# Patient Record
Sex: Male | Born: 1995 | Race: Black or African American | Hispanic: No | Marital: Single | Smoking: Never smoker
Health system: Southern US, Community
[De-identification: ages and names within clinical notes are randomized; demographics above are authoritative.]

## PROBLEM LIST (undated history)

## (undated) DIAGNOSIS — F988 Other specified behavioral and emotional disorders with onset usually occurring in childhood and adolescence: Secondary | ICD-10-CM

---
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
## 2014-06-19 ENCOUNTER — Emergency Department (HOSPITAL_COMMUNITY): Payer: Medicaid Other

## 2014-06-19 ENCOUNTER — Observation Stay (HOSPITAL_COMMUNITY)
Admission: EM | Admit: 2014-06-19 | Discharge: 2014-06-20 | Disposition: A | Discharge status: Home / Self Care | Payer: Medicaid Other | Attending: General Surgery | Admitting: General Surgery

## 2014-06-19 ENCOUNTER — Encounter (HOSPITAL_COMMUNITY): Payer: Self-pay | Admitting: Emergency Medicine

## 2014-06-19 DIAGNOSIS — M542 Cervicalgia: Secondary | ICD-10-CM | POA: Insufficient documentation

## 2014-06-19 DIAGNOSIS — S00511A Abrasion of lip, initial encounter: Secondary | ICD-10-CM | POA: Insufficient documentation

## 2014-06-19 DIAGNOSIS — Y998 Other external cause status: Secondary | ICD-10-CM | POA: Insufficient documentation

## 2014-06-19 DIAGNOSIS — S62002A Unspecified fracture of navicular [scaphoid] bone of left wrist, initial encounter for closed fracture: Secondary | ICD-10-CM

## 2014-06-19 DIAGNOSIS — S301XXA Contusion of abdominal wall, initial encounter: Principal | ICD-10-CM | POA: Insufficient documentation

## 2014-06-19 DIAGNOSIS — F988 Other specified behavioral and emotional disorders with onset usually occurring in childhood and adolescence: Secondary | ICD-10-CM | POA: Insufficient documentation

## 2014-06-19 DIAGNOSIS — S00211A Abrasion of right eyelid and periocular area, initial encounter: Secondary | ICD-10-CM | POA: Insufficient documentation

## 2014-06-19 DIAGNOSIS — S51012A Laceration without foreign body of left elbow, initial encounter: Secondary | ICD-10-CM

## 2014-06-19 DIAGNOSIS — Y92413 State road as the place of occurrence of the external cause: Secondary | ICD-10-CM | POA: Insufficient documentation

## 2014-06-19 DIAGNOSIS — Y9389 Activity, other specified: Secondary | ICD-10-CM | POA: Insufficient documentation

## 2014-06-19 DIAGNOSIS — W458XXA Other foreign body or object entering through skin, initial encounter: Secondary | ICD-10-CM | POA: Insufficient documentation

## 2014-06-19 DIAGNOSIS — W25XXXA Contact with sharp glass, initial encounter: Secondary | ICD-10-CM | POA: Insufficient documentation

## 2014-06-19 DIAGNOSIS — S35513A Injury of unspecified iliac artery, initial encounter: Secondary | ICD-10-CM | POA: Insufficient documentation

## 2014-06-19 LAB — URINALYSIS, ROUTINE W REFLEX MICROSCOPIC
Bilirubin Urine: NEGATIVE
GLUCOSE, UA: NEGATIVE mg/dL
HGB URINE DIPSTICK: NEGATIVE
Ketones, ur: NEGATIVE mg/dL
Leukocytes, UA: NEGATIVE
Nitrite: NEGATIVE
PROTEIN: NEGATIVE mg/dL
Specific Gravity, Urine: 1.044 — ABNORMAL HIGH (ref 1.005–1.030)
Urobilinogen, UA: 1 mg/dL (ref 0.0–1.0)
pH: 7.5 (ref 5.0–8.0)

## 2014-06-19 LAB — I-STAT CHEM 8, ED
BUN: 12 mg/dL (ref 6–23)
CALCIUM ION: 1.25 mmol/L — AB (ref 1.12–1.23)
CREATININE: 1.1 mg/dL (ref 0.50–1.35)
Chloride: 102 mmol/L (ref 96–112)
GLUCOSE: 96 mg/dL (ref 70–99)
HEMATOCRIT: 51 % (ref 39.0–52.0)
HEMOGLOBIN: 17.3 g/dL — AB (ref 13.0–17.0)
Potassium: 3.5 mmol/L (ref 3.5–5.1)
SODIUM: 143 mmol/L (ref 135–145)
TCO2: 24 mmol/L (ref 0–100)

## 2014-06-19 MED ORDER — IOHEXOL 300 MG/ML  SOLN
100.0000 mL | Freq: Once | INTRAMUSCULAR | Status: AC | PRN
  Administered 2014-06-19: 100 mL via INTRAVENOUS

## 2014-06-19 MED ORDER — FENTANYL CITRATE 0.05 MG/ML IJ SOLN
25.0000 ug | Freq: Once | INTRAMUSCULAR | Status: AC
  Administered 2014-06-19: 25 ug via INTRAVENOUS
  Filled 2014-06-19: qty 2

## 2014-06-19 MED ORDER — LIDOCAINE-EPINEPHRINE (PF) 2 %-1:200000 IJ SOLN
20.0000 mL | Freq: Once | INTRAMUSCULAR | Status: AC
  Administered 2014-06-19: 20 mL
  Filled 2014-06-19: qty 20

## 2014-06-19 MED ORDER — TETANUS-DIPHTH-ACELL PERTUSSIS 5-2.5-18.5 LF-MCG/0.5 IM SUSP
0.5000 mL | Freq: Once | INTRAMUSCULAR | Status: AC
  Administered 2014-06-19: 0.5 mL via INTRAMUSCULAR
  Filled 2014-06-19: qty 0.5

## 2014-06-19 MED ORDER — SODIUM CHLORIDE 0.9 % IV BOLUS (SEPSIS)
1000.0000 mL | Freq: Once | INTRAVENOUS | Status: AC
  Administered 2014-06-19: 1000 mL via INTRAVENOUS
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
## 2014-06-19 NOTE — Progress Notes (Signed)
Orthopedic Tech Progress Note Patient Details:  Carl Romero 11/13/1995 [id number] Applied fiberglass thumb spica splint to LUE.  Pulses, sensation, motion intact before and after splinting.  Capillary refill less than 2 seconds before and after splinting. Ortho Devices Type of Ortho Device: Thumb spica splint Ortho Device/Splint Location: LUE Ortho Device/Splint Interventions: Application   Lesle ChrisGilliland, Hideko Esselman L 06/19/2014, 8:38 PM
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
## 2014-06-19 NOTE — ED Notes (Signed)
Patient unable to provide urine sample at this time

## 2014-06-19 NOTE — ED Notes (Signed)
Suture cart at bedside 

## 2014-06-20 LAB — COMPREHENSIVE METABOLIC PANEL WITH GFR
ALT: 17 U/L (ref 0–53)
AST: 21 U/L (ref 0–37)
Albumin: 3.7 g/dL (ref 3.5–5.2)
Alkaline Phosphatase: 51 U/L (ref 39–117)
Anion gap: 10 (ref 5–15)
BUN: 9 mg/dL (ref 6–23)
CO2: 24 mmol/L (ref 19–32)
Calcium: 9.1 mg/dL (ref 8.4–10.5)
Chloride: 102 mmol/L (ref 96–112)
Creatinine, Ser: 1 mg/dL (ref 0.50–1.35)
GFR calc Af Amer: >90 mL/min
GFR calc non Af Amer: >90 mL/min
Glucose, Bld: 107 mg/dL — ABNORMAL HIGH (ref 70–99)
Potassium: 3.8 mmol/L (ref 3.5–5.1)
Sodium: 136 mmol/L (ref 135–145)
Total Bilirubin: 1.5 mg/dL — ABNORMAL HIGH (ref 0.3–1.2)
Total Protein: 6.1 g/dL (ref 6.0–8.3)

## 2014-06-20 LAB — CBC
HCT: 41.8 % (ref 39.0–52.0)
Hemoglobin: 14.6 g/dL (ref 13.0–17.0)
MCH: 30 pg (ref 26.0–34.0)
MCHC: 34.9 g/dL (ref 30.0–36.0)
MCV: 85.8 fL (ref 78.0–100.0)
Platelets: 205 K/uL (ref 150–400)
RBC: 4.87 MIL/uL (ref 4.22–5.81)
RDW: 13.4 % (ref 11.5–15.5)
WBC: 14.7 K/uL — ABNORMAL HIGH (ref 4.0–10.5)

## 2014-06-20 MED ORDER — DOCUSATE SODIUM 100 MG PO CAPS
100.0000 mg | ORAL_CAPSULE | Freq: Two times a day (BID) | ORAL | Status: DC
  Administered 2014-06-20 (×2): 100 mg via ORAL
  Filled 2014-06-20 (×3): qty 1

## 2014-06-20 MED ORDER — MORPHINE SULFATE 2 MG/ML IJ SOLN
1.0000 mg | INTRAMUSCULAR | Status: DC | PRN
  Administered 2014-06-20: 2 mg via INTRAVENOUS
  Filled 2014-06-20: qty 1

## 2014-06-20 MED ORDER — ACETAMINOPHEN 325 MG PO TABS: 650.0000 mg | ORAL_TABLET | ORAL | Status: DC | PRN

## 2014-06-20 MED ORDER — ONDANSETRON HCL 4 MG/2ML IJ SOLN
4.0000 mg | Freq: Four times a day (QID) | INTRAMUSCULAR | Status: DC | PRN
  Administered 2014-06-20: 4 mg via INTRAVENOUS
  Filled 2014-06-20: qty 2

## 2014-06-20 MED ORDER — ACETAMINOPHEN 325 MG PO TABS: 650.0000 mg | ORAL_TABLET | ORAL | Status: AC | PRN

## 2014-06-20 MED ORDER — KCL IN DEXTROSE-NACL 20-5-0.45 MEQ/L-%-% IV SOLN
INTRAVENOUS | Status: DC
  Administered 2014-06-20: 100 mL/h via INTRAVENOUS
  Filled 2014-06-20 (×3): qty 1000

## 2014-06-20 MED ORDER — ONDANSETRON HCL 4 MG PO TABS: 4.0000 mg | ORAL_TABLET | Freq: Four times a day (QID) | ORAL | Status: DC | PRN

## 2014-06-20 MED ORDER — HYDROCODONE-ACETAMINOPHEN 5-325 MG PO TABS
1.0000 | ORAL_TABLET | ORAL | Status: DC | PRN
  Administered 2014-06-20: 1 via ORAL
  Filled 2014-06-20: qty 1

## 2014-06-20 MED ORDER — ENOXAPARIN SODIUM 40 MG/0.4ML ~~LOC~~ SOLN
40.0000 mg | Freq: Every day | SUBCUTANEOUS | Status: DC
  Filled 2014-06-20: qty 0.4

## 2014-06-20 MED ORDER — HYDROCODONE-ACETAMINOPHEN 5-325 MG PO TABS: 1.0000 | ORAL_TABLET | Freq: Four times a day (QID) | ORAL | Status: DC | PRN
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
## 2014-06-20 NOTE — Progress Notes (Signed)
UR completed 
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
## 2014-06-20 NOTE — Discharge Instructions (Signed)
Blunt Abdominal Trauma °A blunt injury to the abdomen can cause pain. The pain is most likely from bruising and stretching of your muscles. This pain is often made worse with movement. Most often these injuries are not serious and get better within 1 week with rest and mild pain medicine. However, internal organs (liver, spleen, kidneys) can be injured with blunt trauma. If you do not get better or if you get worse, further examination may be needed. °Continue with your regular daily activities, but avoid any strenuous activities until your pain is improved. If your stomach is upset, stick to a clear liquid diet and slowly advance to solid food.  °SEEK IMMEDIATE MEDICAL CARE IF:  °· You develop increasing pain, nausea, or repeated vomiting. °· You develop chest pain or breathing difficulty. °· You develop blood in the urine, vomit, or stool. °· You develop weakness, fainting, fever, or other serious complaints. °Document Released: 04/09/2004 Document Revised: 05/25/2011 Document Reviewed: 07/26/2008 °ExitCare® Patient Information ©2015 [employer]. This information is not intended to replace advice given to you by your health care provider. Make sure you discuss any questions you have with your health care provider. ° °

## 2014-06-20 NOTE — Progress Notes (Signed)
Carl Romero discharged Home with family per MD order.  Discharge instructions reviewed and discussed with the patient, all questions and concerns answered. Copy of instructions, care notes for new medication & diagnosis and scripts given to patient.    Medication List    STOP taking these medications        neomycin-polymyxin-pramoxine 1 % cream  Commonly known as:  NEOSPORIN PLUS      TAKE these medications        acetaminophen 325 MG tablet  Commonly known as:  TYLENOL  Take 2 tablets (650 mg total) by mouth every 4 (four) hours as needed for mild pain.     HYDROcodone-acetaminophen 5-325 MG per tablet  Commonly known as:  NORCO/VICODIN  Take 1 tablet by mouth every 6 (six) hours as needed for moderate pain.        Patients skin is clean, dry, with abrasions to left elbow & lower lip, bruises to chest/abd. & splint to left arm . IV site discontinued and catheter remains intact. Site without signs and symptoms of complications. Dressing and pressure applied.  Patient escorted to car by this RN in a wheelchair,  no distress noted upon discharge.  Laural BenesJohnson, [name] 06/20/2014 4:18 PM

## 2014-06-20 NOTE — ED Notes (Signed)
Left hand/arm in splint.  Fingers warm to touch, +CMS,  Moves fingers without difficulty.
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
## 2014-06-22 ENCOUNTER — Other Ambulatory Visit: Payer: Self-pay | Admitting: Orthopedic Surgery

## 2014-06-25 MED ORDER — CEFAZOLIN SODIUM-DEXTROSE 2-3 GM-% IV SOLR
2.0000 g | INTRAVENOUS | Status: AC
  Administered 2014-06-26: 2 g via INTRAVENOUS
  Filled 2014-06-25: qty 50

## 2014-06-25 MED ORDER — SODIUM CHLORIDE 0.45 % IV SOLN: INTRAVENOUS | Status: DC

## 2014-06-25 MED ORDER — CHLORHEXIDINE GLUCONATE 4 % EX LIQD
60.0000 mL | Freq: Once | CUTANEOUS | Status: DC
  Filled 2014-06-25: qty 60

## 2014-06-25 NOTE — Progress Notes (Signed)
I was unable to reach patient by phone.  I left  A message on voice mail.  I instructed the patient to [facility] at 1200  , nothing to eat or drink after midnight.   I instructed the patient to take the following medications in the am with just enough water to get them down:HYDROcodone-acetaminophen.  I asked patient to not wear any lotions, powders, cologne, jewelry, piercing, make-up or nail polish.  I asked the patient to call [phone], in the am if there were any questions or problems.

## 2014-06-26 ENCOUNTER — Ambulatory Visit (HOSPITAL_COMMUNITY): Payer: No Typology Code available for payment source | Admitting: Certified Registered Nurse Anesthetist

## 2014-06-26 ENCOUNTER — Ambulatory Visit (HOSPITAL_COMMUNITY): Payer: Self-pay | Admitting: Certified Registered Nurse Anesthetist

## 2014-06-26 ENCOUNTER — Encounter (HOSPITAL_COMMUNITY): Payer: Self-pay | Admitting: *Deleted

## 2014-06-26 ENCOUNTER — Ambulatory Visit (HOSPITAL_COMMUNITY)
Admission: RE | Admit: 2014-06-26 | Discharge: 2014-06-26 | Disposition: A | Discharge status: Home / Self Care | Payer: Self-pay | Source: Ambulatory Visit | Attending: Orthopedic Surgery | Admitting: Orthopedic Surgery

## 2014-06-26 ENCOUNTER — Encounter (HOSPITAL_COMMUNITY)
Admission: RE | Disposition: A | Discharge status: Home / Self Care | Payer: Self-pay | Source: Ambulatory Visit | Attending: Orthopedic Surgery

## 2014-06-26 DIAGNOSIS — S62032A Displaced fracture of proximal third of navicular [scaphoid] bone of left wrist, initial encounter for closed fracture: Secondary | ICD-10-CM | POA: Insufficient documentation

## 2014-06-26 DIAGNOSIS — Y999 Unspecified external cause status: Secondary | ICD-10-CM | POA: Insufficient documentation

## 2014-06-26 DIAGNOSIS — Y929 Unspecified place or not applicable: Secondary | ICD-10-CM | POA: Insufficient documentation

## 2014-06-26 DIAGNOSIS — Y939 Activity, unspecified: Secondary | ICD-10-CM | POA: Insufficient documentation

## 2014-06-26 HISTORY — PX: ORIF SCAPHOID FRACTURE: SHX2130

## 2014-06-26 SURGERY — OPEN REDUCTION INTERNAL FIXATION (ORIF) SCAPHOID FRACTURE
Anesthesia: Monitor Anesthesia Care | Site: Wrist | Laterality: Left

## 2014-06-26 MED ORDER — OXYCODONE HCL 5 MG/5ML PO SOLN: 5.0000 mg | Freq: Once | ORAL | Status: DC | PRN

## 2014-06-26 MED ORDER — ACETAMINOPHEN 325 MG PO TABS: 325.0000 mg | ORAL_TABLET | ORAL | Status: DC | PRN

## 2014-06-26 MED ORDER — ROPIVACAINE HCL 5 MG/ML IJ SOLN
INTRAMUSCULAR | Status: DC | PRN
  Administered 2014-06-26: 25 mL via PERINEURAL

## 2014-06-26 MED ORDER — PROPOFOL 10 MG/ML IV BOLUS
INTRAVENOUS | Status: AC
  Filled 2014-06-26: qty 20

## 2014-06-26 MED ORDER — FENTANYL CITRATE 0.05 MG/ML IJ SOLN
INTRAMUSCULAR | Status: AC
  Administered 2014-06-26: 50 ug
  Filled 2014-06-26: qty 2

## 2014-06-26 MED ORDER — FENTANYL CITRATE 0.05 MG/ML IJ SOLN: 25.0000 ug | INTRAMUSCULAR | Status: DC | PRN

## 2014-06-26 MED ORDER — PROPOFOL INFUSION 10 MG/ML OPTIME
INTRAVENOUS | Status: DC | PRN
  Administered 2014-06-26: 50 ug/kg/min via INTRAVENOUS

## 2014-06-26 MED ORDER — OXYCODONE HCL 5 MG PO TABS
5.0000 mg | ORAL_TABLET | Freq: Once | ORAL | Status: DC | PRN
Start: 2014-06-26 — End: 2014-06-26

## 2014-06-26 MED ORDER — MIDAZOLAM HCL 5 MG/5ML IJ SOLN
INTRAMUSCULAR | Status: DC | PRN
  Administered 2014-06-26 (×2): 1 mg via INTRAVENOUS

## 2014-06-26 MED ORDER — LIDOCAINE HCL (CARDIAC) 20 MG/ML IV SOLN
INTRAVENOUS | Status: DC | PRN
  Administered 2014-06-26: 40 mg via INTRAVENOUS

## 2014-06-26 MED ORDER — MIDAZOLAM HCL 2 MG/2ML IJ SOLN
INTRAMUSCULAR | Status: AC
Start: 2014-06-26 — End: 2014-06-26
  Administered 2014-06-26: 2 mg
  Filled 2014-06-26: qty 2

## 2014-06-26 MED ORDER — PROPOFOL 10 MG/ML IV BOLUS
INTRAVENOUS | Status: DC | PRN
  Administered 2014-06-26: 30 mg via INTRAVENOUS

## 2014-06-26 MED ORDER — MIDAZOLAM HCL 2 MG/2ML IJ SOLN
INTRAMUSCULAR | Status: AC
  Filled 2014-06-26: qty 2

## 2014-06-26 MED ORDER — ACETAMINOPHEN 160 MG/5ML PO SOLN
325.0000 mg | ORAL | Status: DC | PRN
  Filled 2014-06-26: qty 25

## 2014-06-26 MED ORDER — LACTATED RINGERS IV SOLN
INTRAVENOUS | Status: DC | PRN
  Administered 2014-06-26: 14:00:00 via INTRAVENOUS

## 2014-06-26 MED ORDER — FENTANYL CITRATE 0.05 MG/ML IJ SOLN
INTRAMUSCULAR | Status: AC
  Filled 2014-06-26: qty 5

## 2014-06-26 MED ORDER — FENTANYL CITRATE 0.05 MG/ML IJ SOLN
INTRAMUSCULAR | Status: DC | PRN
  Administered 2014-06-26: 50 ug via INTRAVENOUS

## 2014-06-26 MED ORDER — CEPHALEXIN 500 MG PO CAPS: 500.0000 mg | ORAL_CAPSULE | Freq: Four times a day (QID) | ORAL | Status: DC

## 2014-06-26 MED ORDER — DEXAMETHASONE SODIUM PHOSPHATE 4 MG/ML IJ SOLN
INTRAMUSCULAR | Status: AC
  Administered 2014-06-26: 4 mg via PERINEURAL
  Filled 2014-06-26: qty 1

## 2014-06-26 MED ORDER — HYDROCODONE-ACETAMINOPHEN 5-325 MG PO TABS: 1.0000 | ORAL_TABLET | Freq: Four times a day (QID) | ORAL | Status: DC | PRN

## 2014-06-26 MED ORDER — BUPIVACAINE HCL (PF) 0.25 % IJ SOLN
INTRAMUSCULAR | Status: AC
  Filled 2014-06-26: qty 30

## 2014-06-26 MED ORDER — LACTATED RINGERS IV SOLN
Freq: Once | INTRAVENOUS | Status: AC
Start: 2014-06-26 — End: 2014-06-26
  Administered 2014-06-26: 13:00:00 via INTRAVENOUS

## 2014-06-26 MED ORDER — 0.9 % SODIUM CHLORIDE (POUR BTL) OPTIME
TOPICAL | Status: DC | PRN
  Administered 2014-06-26: 1000 mL

## 2014-06-26 SURGICAL SUPPLY — 48 items
BANDAGE ELASTIC 3 VELCRO ST LF (GAUZE/BANDAGES/DRESSINGS) ×3 IMPLANT
BIT DRILL MINI LNG ACUTRAK 2 (BIT) IMPLANT
BNDG CMPR 9X4 STRL LF SNTH (GAUZE/BANDAGES/DRESSINGS) ×1
BNDG CONFORM 3 STRL LF (GAUZE/BANDAGES/DRESSINGS) ×2 IMPLANT
BNDG ESMARK 4X9 LF (GAUZE/BANDAGES/DRESSINGS) ×3 IMPLANT
BNDG GAUZE ELAST 4 BULKY (GAUZE/BANDAGES/DRESSINGS) ×5 IMPLANT
CAP PIN ORTHO PINK (CAP) IMPLANT
CORDS BIPOLAR (ELECTRODE) ×3 IMPLANT
COVER SURGICAL LIGHT HANDLE (MISCELLANEOUS) ×3 IMPLANT
CUFF TOURNIQUET SINGLE 18IN (TOURNIQUET CUFF) ×3 IMPLANT
CUFF TOURNIQUET SINGLE 24IN (TOURNIQUET CUFF) IMPLANT
DRAPE OEC MINIVIEW 54X84 (DRAPES) ×2 IMPLANT
DRESSING ADAPTIC 1/2  N-ADH (PACKING) ×2 IMPLANT
DRILL MINI LNG ACUTRAK 2 (BIT) ×3
DRSG EMULSION OIL 3X3 NADH (GAUZE/BANDAGES/DRESSINGS) ×3 IMPLANT
GAUZE SPONGE 4X4 12PLY STRL (GAUZE/BANDAGES/DRESSINGS) ×3 IMPLANT
GAUZE XEROFORM 1X8 LF (GAUZE/BANDAGES/DRESSINGS) ×2 IMPLANT
GLOVE BIOGEL M STRL SZ7.5 (GLOVE) ×3 IMPLANT
GLOVE SS BIOGEL STRL SZ 8 (GLOVE) ×2 IMPLANT
GLOVE SUPERSENSE BIOGEL SZ 8 (GLOVE) ×4
GOWN STRL REUS W/ TWL LRG LVL3 (GOWN DISPOSABLE) ×3 IMPLANT
GOWN STRL REUS W/ TWL XL LVL3 (GOWN DISPOSABLE) ×2 IMPLANT
GOWN STRL REUS W/TWL LRG LVL3 (GOWN DISPOSABLE) ×9
GOWN STRL REUS W/TWL XL LVL3 (GOWN DISPOSABLE) ×6
GUIDEWIRE ORTHO MINI ACTK .045 (WIRE) ×4 IMPLANT
KIT BASIN OR (CUSTOM PROCEDURE TRAY) ×3 IMPLANT
KIT ROOM TURNOVER OR (KITS) ×3 IMPLANT
NDL HYPO 25GX1X1/2 BEV (NEEDLE) ×1 IMPLANT
NEEDLE HYPO 25GX1X1/2 BEV (NEEDLE) ×3 IMPLANT
NS IRRIG 1000ML POUR BTL (IV SOLUTION) ×3 IMPLANT
PACK ORTHO EXTREMITY (CUSTOM PROCEDURE TRAY) ×3 IMPLANT
PAD ARMBOARD 7.5X6 YLW CONV (MISCELLANEOUS) ×6 IMPLANT
PAD CAST 3X4 CTTN HI CHSV (CAST SUPPLIES) ×1 IMPLANT
PADDING CAST COTTON 3X4 STRL (CAST SUPPLIES) ×3
SCREW MINI ACTRK 22MM (Screw) ×2 IMPLANT
SPLINT PLASTER CAST XFAST 5X30 (CAST SUPPLIES) IMPLANT
SPLINT PLASTER XFAST SET 5X30 (CAST SUPPLIES) ×2
SPONGE GAUZE 4X4 12PLY STER LF (GAUZE/BANDAGES/DRESSINGS) ×2 IMPLANT
SUCTION FRAZIER TIP 10 FR DISP (SUCTIONS) IMPLANT
SUT PROLENE 4 0 PS 2 18 (SUTURE) ×3 IMPLANT
SUT VIC AB 3-0 FS2 27 (SUTURE) IMPLANT
SYR CONTROL 10ML LL (SYRINGE) ×3 IMPLANT
TOWEL OR 17X24 6PK STRL BLUE (TOWEL DISPOSABLE) ×3 IMPLANT
TOWEL OR 17X26 10 PK STRL BLUE (TOWEL DISPOSABLE) ×3 IMPLANT
TUBE CONNECTING 12'X1/4 (SUCTIONS)
TUBE CONNECTING 12X1/4 (SUCTIONS) IMPLANT
UNDERPAD 30X30 INCONTINENT (UNDERPADS AND DIAPERS) ×3 IMPLANT
WATER STERILE IRR 1000ML POUR (IV SOLUTION) ×3 IMPLANT

## 2014-06-26 NOTE — Anesthesia Postprocedure Evaluation (Signed)
  Anesthesia Post-op Note  Patient: Carl Romero  Procedure(s) Performed: Procedure(s): OPEN REDUCTION INTERNAL FIXATION (ORIF) LEFT SCAPHOID FRACTURE (Left)  Patient Location: PACU  Anesthesia Type:Regional  Level of Consciousness: awake  Airway and Oxygen Therapy: Patient Spontanous Breathing  Post-op Pain: mild  Post-op Assessment: Post-op Vital signs reviewed  Post-op Vital Signs: Reviewed  Last Vitals:  Filed Vitals:   06/26/14 1645  BP:   Pulse: 63  Temp:   Resp: 13    Complications: No apparent anesthesia complications

## 2014-06-26 NOTE — Progress Notes (Signed)
Orthopedic Tech Progress Note Patient Details:  Carl Romero 16-Jul-1995 [id number]  Ortho Devices Type of Ortho Device: Arm sling Ortho Device/Splint Location: lue Ortho Device/Splint Interventions: Application Viewed order from doctor's order list  Nikki DomCrawford, Juanette Urizar 06/26/2014, 4:39 PM

## 2014-06-26 NOTE — Anesthesia Preprocedure Evaluation (Addendum)
Anesthesia Evaluation  Patient identified by MRN, date of birth, ID band Patient awake    Reviewed: Allergy & Precautions, NPO status , Patient's Chart, lab work & pertinent test results  History of Anesthesia Complications Negative for: history of anesthetic complications  Airway Mallampati: I  TM Distance: >3 FB Neck ROM: Full    Dental  (+) Teeth Intact, Dental Advisory Given   Pulmonary neg pulmonary ROS,  breath sounds clear to auscultation        Cardiovascular negative cardio ROS  Rhythm:Regular Rate:Normal     Neuro/Psych PSYCHIATRIC DISORDERS negative neurological ROS     GI/Hepatic negative GI ROS, Neg liver ROS,   Endo/Other  negative endocrine ROS  Renal/GU negative Renal ROS     Musculoskeletal   Abdominal   Peds  Hematology negative hematology ROS (+)   Anesthesia Other Findings   Reproductive/Obstetrics                            Anesthesia Physical Anesthesia Plan  ASA: I  Anesthesia Plan: Regional and MAC   Post-op Pain Management:    Induction: Intravenous  Airway Management Planned: Nasal Cannula  Additional Equipment: None  Intra-op Plan:   Post-operative Plan: Extubation in OR  Informed Consent: I have reviewed the patients History and Physical, chart, labs and discussed the procedure including the risks, benefits and alternatives for the proposed anesthesia with the patient or authorized representative who has indicated his/her understanding and acceptance.   Dental advisory given  Plan Discussed with: Surgeon and CRNA  Anesthesia Plan Comments:        Anesthesia Quick Evaluation

## 2014-06-26 NOTE — Transfer of Care (Signed)
Immediate Anesthesia Transfer of Care Note  Patient: Carl Romero  Procedure(s) Performed: Procedure(s): OPEN REDUCTION INTERNAL FIXATION (ORIF) LEFT SCAPHOID FRACTURE (Left)  Patient Location: PACU  Anesthesia Type:MAC combined with regional for post-op pain  Level of Consciousness: awake, alert  and oriented  Airway & Oxygen Therapy: Patient Spontanous Breathing and Patient connected to nasal cannula oxygen  Post-op Assessment: Report given to RN and Post -op Vital signs reviewed and stable  Post vital signs: Reviewed and stable  Last Vitals:  Filed Vitals:   06/26/14 1200  BP: 149/87  Pulse: 72  Temp: 37.1 C  Resp: 18    Complications: No apparent anesthesia complications

## 2014-06-26 NOTE — Discharge Instructions (Signed)
.  We recommend that you to take vitamin C 1000 mg a day to promote healing. °We also recommend that if you require  pain medicine that you take a stool softener to prevent constipation as most pain medicines will have constipation side effects. We recommend either Peri-Colace or Senokot and recommend that you also consider adding MiraLAX to prevent the constipation affects from pain medicine if you are required to use them. These medicines are over the counter and maybe purchased at a local pharmacy. A cup of yogurt and a probiotic can also be helpful during the recovery process as the medicines can disrupt your intestinal environment. °.Keep bandage clean and dry.  Call for any problems.  No smoking.  Criteria for driving a car: you should be off your pain medicine for 7-8 hours, able to drive one handed(confident), thinking clearly and feeling able in your judgement to drive. °Continue elevation as it will decrease swelling.  If instructed by MD move your fingers within the confines of the bandage/splint.  Use ice if instructed by your MD. Call immediately for any sudden loss of feeling in your hand/arm or change in functional abilities of the extremity. °

## 2014-06-26 NOTE — Op Note (Signed)
See dictation# [id number] Brendan Gruwell Md
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
## 2014-06-26 NOTE — Anesthesia Procedure Notes (Signed)
Anesthesia Regional Block:  Supraclavicular block  Pre-Anesthetic Checklist: ,, timeout performed, Correct Patient, Correct Site, Correct Laterality, Correct Procedure, Correct Position, site marked, Risks and benefits discussed,  Surgical consent,  Pre-op evaluation,  At surgeon's request and post-op pain management  Laterality: Upper and Left  Prep: chloraprep       Needles:  Injection technique: Single-shot  Needle Type: Echogenic Needle          Additional Needles:  Procedures: ultrasound guided (picture in chart) Supraclavicular block Narrative:  Injection made incrementally with aspirations every 5 mL.  Performed by: Personally   Additional Notes: H+P and labs reviewed, risks and benefits discussed with patient, procedure tolerated well without complications      
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
## 2014-06-28 ENCOUNTER — Encounter (HOSPITAL_COMMUNITY): Payer: Self-pay | Admitting: Orthopedic Surgery
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
## 2016-04-06 ENCOUNTER — Ambulatory Visit (INDEPENDENT_AMBULATORY_CARE_PROVIDER_SITE_OTHER): Payer: Self-pay | Admitting: Physician Assistant

## 2016-04-06 ENCOUNTER — Encounter: Payer: Self-pay | Admitting: Physician Assistant

## 2016-04-06 VITALS — BP 122/76 | HR 81 | Temp 98.8°F | Ht 72.0 in | Wt 184.8 lb

## 2016-04-06 DIAGNOSIS — J029 Acute pharyngitis, unspecified: Secondary | ICD-10-CM

## 2016-04-06 LAB — POCT RAPID STREP A (OFFICE): RAPID STREP A SCREEN: NEGATIVE

## 2016-04-06 MED ORDER — NAPROXEN 500 MG PO TABS: 500.0000 mg | ORAL_TABLET | Freq: Two times a day (BID) | ORAL | 0 refills | Status: DC

## 2016-04-06 NOTE — Patient Instructions (Signed)
     IF you received an x-ray today, you will receive an invoice from [facility]. Please contact [facility] at [phone] with questions or concerns regarding your invoice.   IF you received labwork today, you will receive an invoice from [facility]. Please contact [facility] at [phone] with questions or concerns regarding your invoice.   Our billing staff will not be able to assist you with questions regarding bills from these companies.  You will be contacted with the lab results as soon as they are available. The fastest way to get your results is to activate your My Chart account. Instructions are located on the last page of this paperwork. If you have not heard from us regarding the results in 2 weeks, please contact this office.     

## 2016-04-06 NOTE — Progress Notes (Signed)
  04/06/2016 3:18 PM   DOB: 01-11-1996 / MRN: [id number]  SUBJECTIVE:  Carl Romero is a 21 y.o. male presenting for sore throat, cough and nasal congestion that started 8 days ago.  The nasal congestion is mostly resolved and the cough has resolved.  The sore throat has improved and now feels mostly scratchy. Denies fever, chills, nausea.  Is tolerating PO.  No dizziness or diaphoresis.    Review of Systems  Constitutional: Negative for chills and fever.  Cardiovascular: Negative for chest pain.  Gastrointestinal: Negative for nausea.  Genitourinary: Negative for dysuria.  Musculoskeletal: Negative for myalgias.  Skin: Negative for rash.  Neurological: Negative for dizziness.    The problem list and medications were reviewed and updated by myself where necessary and exist elsewhere in the encounter.   OBJECTIVE:  BP 122/76 (BP Location: Left Arm, Patient Position: Sitting, Cuff Size: Normal)   Pulse 81   Temp 98.8 F (37.1 C) (Oral)   Ht 6' (1.829 m)   Wt 184 lb 12.8 oz (83.8 kg)   BMI 25.06 kg/m   Physical Exam  Constitutional: He is oriented to person, place, and time. He appears well-developed. He does not appear ill.  HENT:  Mouth/Throat: Uvula is midline and mucous membranes are normal. Posterior oropharyngeal erythema present. No oropharyngeal exudate, posterior oropharyngeal edema or tonsillar abscesses.  Eyes: Conjunctivae and EOM are normal. Pupils are equal, round, and reactive to light.  Cardiovascular: Normal rate.   Pulmonary/Chest: Effort normal.  Abdominal: He exhibits no distension.  Musculoskeletal: Normal range of motion.  Neurological: He is alert and oriented to person, place, and time. No cranial nerve deficit. Coordination normal.  Skin: Skin is warm and dry. He is not diaphoretic.  Psychiatric: He has a normal mood and affect.  Nursing note and vitals reviewed.   No results found for this or any previous visit (from the past 72 hour(s)).  No  results found.  ASSESSMENT AND PLAN:  Frederik SchmidtKeenan was seen today for cough and sore throat.  Diagnoses and all orders for this visit:  Sore throat: Improving. Likely viral. Advised NSAIDs. RTC or call if not improving.  -     POCT rapid strep A -     Culture, Group A Strep -     naproxen (NAPROSYN) 500 MG tablet; Take 1 tablet (500 mg total) by mouth 2 (two) times daily with a meal. -     Care order/instruction:    The patient is advised to call or return to clinic if he does not see an improvement in symptoms, or to seek the care of the closest emergency department if he worsens with the above plan.   Deliah BostonMichael Cathlyn Tersigni, MHS, PA-C [facility] 04/06/2016 3:18 PM

## 2016-04-09 LAB — CULTURE, GROUP A STREP: Strep A Culture: NEGATIVE

## 2017-05-16 ENCOUNTER — Other Ambulatory Visit: Payer: Self-pay

## 2017-05-16 ENCOUNTER — Emergency Department (HOSPITAL_BASED_OUTPATIENT_CLINIC_OR_DEPARTMENT_OTHER)
Admission: EM | Admit: 2017-05-16 | Discharge: 2017-05-16 | Disposition: A | Discharge status: Home / Self Care | Payer: BLUE CROSS/BLUE SHIELD | Attending: Emergency Medicine | Admitting: Emergency Medicine

## 2017-05-16 ENCOUNTER — Encounter (HOSPITAL_BASED_OUTPATIENT_CLINIC_OR_DEPARTMENT_OTHER): Payer: Self-pay | Admitting: Emergency Medicine

## 2017-05-16 DIAGNOSIS — Z79899 Other long term (current) drug therapy: Secondary | ICD-10-CM | POA: Diagnosis not present

## 2017-05-16 DIAGNOSIS — R509 Fever, unspecified: Secondary | ICD-10-CM | POA: Diagnosis present

## 2017-05-16 DIAGNOSIS — K529 Noninfective gastroenteritis and colitis, unspecified: Secondary | ICD-10-CM | POA: Diagnosis not present

## 2017-05-16 LAB — COMPREHENSIVE METABOLIC PANEL
ALK PHOS: 53 U/L (ref 38–126)
ALT: 36 U/L (ref 17–63)
ANION GAP: 10 (ref 5–15)
AST: 33 U/L (ref 15–41)
Albumin: 4.8 g/dL (ref 3.5–5.0)
BILIRUBIN TOTAL: 1.6 mg/dL — AB (ref 0.3–1.2)
BUN: 12 mg/dL (ref 6–20)
CALCIUM: 9.6 mg/dL (ref 8.9–10.3)
CO2: 23 mmol/L (ref 22–32)
Chloride: 105 mmol/L (ref 101–111)
Creatinine, Ser: 1.04 mg/dL (ref 0.61–1.24)
GFR calc Af Amer: >60 mL/min (ref >60)
Glucose, Bld: 126 mg/dL — ABNORMAL HIGH (ref 65–99)
Potassium: 3.8 mmol/L (ref 3.5–5.1)
Sodium: 138 mmol/L (ref 135–145)
TOTAL PROTEIN: 8.1 g/dL (ref 6.5–8.1)

## 2017-05-16 LAB — CBC
HCT: 45.1 % (ref 39.0–52.0)
HEMOGLOBIN: 15.8 g/dL (ref 13.0–17.0)
MCH: 29.5 pg (ref 26.0–34.0)
MCHC: 35 g/dL (ref 30.0–36.0)
MCV: 84.1 fL (ref 78.0–100.0)
Platelets: 230 10*3/uL (ref 150–400)
RBC: 5.36 MIL/uL (ref 4.22–5.81)
RDW: 13.6 % (ref 11.5–15.5)
WBC: 14.8 10*3/uL — AB (ref 4.0–10.5)

## 2017-05-16 LAB — LIPASE, BLOOD: Lipase: 23 U/L (ref 11–51)

## 2017-05-16 LAB — I-STAT CG4 LACTIC ACID, ED: LACTIC ACID, VENOUS: 1.63 mmol/L (ref 0.5–1.9)

## 2017-05-16 MED ORDER — ONDANSETRON 4 MG PO TBDP: ORAL_TABLET | ORAL | 0 refills | Status: DC

## 2017-05-16 MED ORDER — SODIUM CHLORIDE 0.9 % IV BOLUS (SEPSIS)
1000.0000 mL | Freq: Once | INTRAVENOUS | Status: AC
  Administered 2017-05-16: 1000 mL via INTRAVENOUS

## 2017-05-16 MED ORDER — ACETAMINOPHEN 500 MG PO TABS
1000.0000 mg | ORAL_TABLET | Freq: Once | ORAL | Status: AC
  Administered 2017-05-16: 1000 mg via ORAL
  Filled 2017-05-16: qty 2

## 2017-05-16 MED ORDER — ONDANSETRON HCL 4 MG/2ML IJ SOLN
4.0000 mg | Freq: Once | INTRAMUSCULAR | Status: AC
  Administered 2017-05-16: 4 mg via INTRAVENOUS
  Filled 2017-05-16: qty 2

## 2017-05-16 MED ORDER — DIPHENOXYLATE-ATROPINE 2.5-0.025 MG PO TABS
1.0000 | ORAL_TABLET | Freq: Once | ORAL | Status: AC
  Administered 2017-05-16: 1 via ORAL
  Filled 2017-05-16: qty 1

## 2017-05-16 MED ORDER — KETOROLAC TROMETHAMINE 30 MG/ML IJ SOLN
30.0000 mg | Freq: Once | INTRAMUSCULAR | Status: AC
  Administered 2017-05-16: 30 mg via INTRAVENOUS
  Filled 2017-05-16: qty 1

## 2017-05-16 NOTE — ED Triage Notes (Signed)
Pt reports lower abd pain, N/V/D since last night.
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
## 2017-11-01 ENCOUNTER — Encounter (HOSPITAL_BASED_OUTPATIENT_CLINIC_OR_DEPARTMENT_OTHER): Payer: Self-pay

## 2017-11-01 ENCOUNTER — Other Ambulatory Visit: Payer: Self-pay

## 2017-11-01 ENCOUNTER — Emergency Department (HOSPITAL_BASED_OUTPATIENT_CLINIC_OR_DEPARTMENT_OTHER)
Admission: EM | Admit: 2017-11-01 | Discharge: 2017-11-01 | Disposition: A | Discharge status: Home / Self Care | Payer: BLUE CROSS/BLUE SHIELD | Attending: Emergency Medicine | Admitting: Emergency Medicine

## 2017-11-01 DIAGNOSIS — M545 Low back pain, unspecified: Secondary | ICD-10-CM

## 2017-11-01 DIAGNOSIS — Z79899 Other long term (current) drug therapy: Secondary | ICD-10-CM | POA: Insufficient documentation

## 2017-11-01 DIAGNOSIS — X500XXA Overexertion from strenuous movement or load, initial encounter: Secondary | ICD-10-CM | POA: Insufficient documentation

## 2017-11-01 DIAGNOSIS — Y9259 Other trade areas as the place of occurrence of the external cause: Secondary | ICD-10-CM | POA: Insufficient documentation

## 2017-11-01 DIAGNOSIS — Y9389 Activity, other specified: Secondary | ICD-10-CM | POA: Insufficient documentation

## 2017-11-01 DIAGNOSIS — Y990 Civilian activity done for income or pay: Secondary | ICD-10-CM | POA: Insufficient documentation

## 2017-11-01 MED ORDER — CYCLOBENZAPRINE HCL 10 MG PO TABS: 10.0000 mg | ORAL_TABLET | Freq: Two times a day (BID) | ORAL | 0 refills | Status: DC | PRN

## 2017-11-01 MED ORDER — LIDOCAINE 5 % EX PTCH: 1.0000 | MEDICATED_PATCH | CUTANEOUS | 0 refills | Status: DC

## 2017-11-01 MED ORDER — NAPROXEN 500 MG PO TABS: 500.0000 mg | ORAL_TABLET | Freq: Two times a day (BID) | ORAL | 0 refills | Status: DC

## 2017-11-01 NOTE — ED Notes (Signed)
ED Provider at bedside. 

## 2017-11-01 NOTE — Discharge Instructions (Addendum)
Naprosyn for pain. Flexeril for spasms. Lidocaine patch to help with pain. Back exercises and stretches. No heavy lifting. Follow up with family doctor.
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
## 2017-11-01 NOTE — ED Triage Notes (Signed)
Pt c/o pain to lower back x 1 month [facility]

## 2019-06-17 ENCOUNTER — Ambulatory Visit: Payer: Self-pay | Attending: Internal Medicine

## 2019-06-17 DIAGNOSIS — Z23 Encounter for immunization: Secondary | ICD-10-CM

## 2019-06-17 NOTE — Progress Notes (Signed)
   Covid-19 Vaccination Clinic  Name:  Carl Romero    MRN: [id number] DOB: December 13, 1995  06/17/2019  Mr. Carl Romero was observed post Covid-19 immunization for 15 minutes without incident. He was provided with Vaccine Information Sheet and instruction to access the V-Safe system.   Mr. Carl Romero was instructed to call 911 with any severe reactions post vaccine: Marland Kitchen Difficulty breathing  . Swelling of face and throat  . A fast heartbeat  . A bad rash all over body  . Dizziness and weakness   Immunizations Administered    Name Date Dose VIS Date Route   Moderna COVID-19 Vaccine 06/17/2019 12:25 PM 0.5 mL 02/14/2019 Intramuscular   Manufacturer: Moderna   Lot: [id number]   NDC: 22482-500-37

## 2019-07-15 ENCOUNTER — Ambulatory Visit: Payer: Self-pay

## 2020-07-11 ENCOUNTER — Ambulatory Visit (HOSPITAL_COMMUNITY)
Admission: EM | Admit: 2020-07-11 | Discharge: 2020-07-11 | Disposition: A | Discharge status: Home / Self Care | Payer: Self-pay | Attending: Family Medicine | Admitting: Family Medicine

## 2020-07-11 ENCOUNTER — Other Ambulatory Visit: Payer: Self-pay

## 2020-07-11 DIAGNOSIS — K0889 Other specified disorders of teeth and supporting structures: Secondary | ICD-10-CM

## 2020-07-11 MED ORDER — IBUPROFEN 800 MG PO TABS: 800.0000 mg | ORAL_TABLET | Freq: Three times a day (TID) | ORAL | 0 refills | Status: DC

## 2020-07-11 MED ORDER — AMOXICILLIN-POT CLAVULANATE 875-125 MG PO TABS: 1.0000 | ORAL_TABLET | Freq: Two times a day (BID) | ORAL | 0 refills | Status: DC

## 2020-07-11 NOTE — ED Provider Notes (Signed)
  [facility]   [id number] 07/11/20 Arrival Time: 1637  ASSESSMENT & PLAN:  1. Pain, dental    No sign of abscess requiring I&D at this time. Discussed.  Meds ordered this encounter  Medications  . ibuprofen (ADVIL) 800 MG tablet    Sig: Take 1 tablet (800 mg total) by mouth 3 (three) times daily with meals.    Dispense:  21 tablet    Refill:  0  . amoxicillin-clavulanate (AUGMENTIN) 875-125 MG tablet    Sig: Take 1 tablet by mouth every 12 (twelve) hours.    Dispense:  20 tablet    Refill:  0   No work note needed. Dental resource written instructions given. He will schedule dental evaluation as soon as possible if not improving over the next 24-48 hours.  Reviewed expectations re: course of current medical issues. Questions answered. Outlined signs and symptoms indicating need for more acute intervention. Patient verbalized understanding. After Visit Summary given.   SUBJECTIVE:  Carl Romero is a 25 y.o. male who reports gradual onset of right lower dental pain described as aching/throbbing with hot/cold sensitivity. Present past month but worse over past few days. Fever: absent. Tolerating PO intake but reports pain with chewing. Normal swallowing. He does not see a dentist regularly. No neck swelling or pain. OTC analgesics without relief.  ROS: As per HPI.  OBJECTIVE: Vitals:   07/11/20 1734  BP: (!) 166/88  Pulse: 76  Resp: 16  Temp: 98.6 F (37 C)  TempSrc: Oral  SpO2: 98%    General appearance: alert; no distress HENT: normocephalic; atraumatic; dentition: fair; right lower gums with cracked molar; without areas of fluctuance, drainage, or bleeding and with tenderness to palpation; normal jaw movement without difficulty Neck: supple without LAD; FROM; trachea midline Lungs: normal respirations; unlabored; speaks full sentences without difficulty Skin: warm and dry Psychological: alert and cooperative; normal mood and affect  No Known  Allergies  Past Medical History:  Diagnosis Date  . Attention deficit disorder (ADD)    Social History   Socioeconomic History  . Marital status: Single    Spouse name: Not on file  . Number of children: Not on file  . Years of education: Not on file  . Highest education level: Not on file  Occupational History  . Not on file  Tobacco Use  . Smoking status: Never Smoker  . Smokeless tobacco: Never Used  Substance and Sexual Activity  . Alcohol use: Yes    Comment: weekly  . Drug use: Not Currently    Frequency: 7.0 times per week    Types: Marijuana  . Sexual activity: Not on file  Other Topics Concern  . Not on file  Social History Narrative  . Not on file   Social Determinants of Health   Financial Resource Strain: Not on file  Food Insecurity: Not on file  Transportation Needs: Not on file  Physical Activity: Not on file  Stress: Not on file  Social Connections: Not on file  Intimate Partner Violence: Not on file   No family history on file. Past Surgical History:  Procedure Laterality Date  . ORIF SCAPHOID FRACTURE Left 06/26/2014   Procedure: OPEN REDUCTION INTERNAL FIXATION (ORIF) LEFT SCAPHOID FRACTURE;  Surgeon: Dominica Severin, MD;  Location: [facility] OR;  Service: Orthopedics;  Laterality: Left;     Mardella Layman, MD 07/11/20 1827

## 2020-07-11 NOTE — ED Triage Notes (Signed)
Pt here for right lower pain onset 2 months but last couple of days have been more painful  Denies f/v/n/d.... reports he has a dentist appt next week but he is unable to bear the pain at the moment.   A&O x4... NAD.Marland Kitchen. ambulatory

## 2020-09-07 ENCOUNTER — Emergency Department (HOSPITAL_BASED_OUTPATIENT_CLINIC_OR_DEPARTMENT_OTHER)
Admission: EM | Admit: 2020-09-07 | Discharge: 2020-09-07 | Disposition: A | Discharge status: Home / Self Care | Payer: No Typology Code available for payment source | Attending: Emergency Medicine | Admitting: Emergency Medicine

## 2020-09-07 ENCOUNTER — Other Ambulatory Visit: Payer: Self-pay

## 2020-09-07 ENCOUNTER — Emergency Department (HOSPITAL_BASED_OUTPATIENT_CLINIC_OR_DEPARTMENT_OTHER): Payer: No Typology Code available for payment source | Admitting: Radiology

## 2020-09-07 DIAGNOSIS — Y92410 Unspecified street and highway as the place of occurrence of the external cause: Secondary | ICD-10-CM | POA: Insufficient documentation

## 2020-09-07 DIAGNOSIS — S34109A Unspecified injury to unspecified level of lumbar spinal cord, initial encounter: Secondary | ICD-10-CM | POA: Diagnosis present

## 2020-09-07 DIAGNOSIS — S39012A Strain of muscle, fascia and tendon of lower back, initial encounter: Secondary | ICD-10-CM | POA: Diagnosis not present

## 2020-09-07 MED ORDER — CYCLOBENZAPRINE HCL 10 MG PO TABS: 10.0000 mg | ORAL_TABLET | Freq: Three times a day (TID) | ORAL | 0 refills | Status: DC | PRN
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
## 2020-09-07 NOTE — Discharge Instructions (Addendum)
Apply ice for 30 minutes at a time, 4 times a day, as needed.  Take ibuprofen or naproxen as needed for pain.  To get additional pain relief, you may add acetaminophen.

## 2021-06-25 ENCOUNTER — Ambulatory Visit
Admission: RE | Admit: 2021-06-25 | Discharge: 2021-06-25 | Disposition: A | Discharge status: Home / Self Care | Payer: Self-pay | Source: Ambulatory Visit | Attending: Urgent Care | Admitting: Urgent Care

## 2021-06-25 VITALS — BP 146/83 | HR 66 | Temp 98.1°F | Resp 18

## 2021-06-25 DIAGNOSIS — R590 Localized enlarged lymph nodes: Secondary | ICD-10-CM

## 2021-06-25 DIAGNOSIS — L089 Local infection of the skin and subcutaneous tissue, unspecified: Secondary | ICD-10-CM

## 2021-06-25 MED ORDER — CEPHALEXIN 500 MG PO CAPS: 500.0000 mg | ORAL_CAPSULE | Freq: Four times a day (QID) | ORAL | 0 refills | Status: AC

## 2021-06-25 NOTE — Discharge Instructions (Signed)
It appears that you have a skin infection to your armpit. ?The bumps are likely swollen lymph nodes. ?Continue the warm compresses to the area. ?Start taking the antibiotic four times daily until gone. ?If you develop a single painful lesion with a white head on it, return to clinic for draining. ?If the bumps persist after completing the antibiotics, please establish care with a PCP to obtain further workup. ?

## 2021-06-25 NOTE — ED Triage Notes (Signed)
Pt presents with a boil under his left armpit x 2 weeks.  ?
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
## 2022-10-30 ENCOUNTER — Encounter (HOSPITAL_COMMUNITY): Payer: Self-pay

## 2022-10-30 ENCOUNTER — Ambulatory Visit (HOSPITAL_COMMUNITY)
Admission: EM | Admit: 2022-10-30 | Discharge: 2022-10-30 | Disposition: A | Discharge status: Home / Self Care | Payer: BC Managed Care – PPO | Attending: Emergency Medicine | Admitting: Emergency Medicine

## 2022-10-30 DIAGNOSIS — K0889 Other specified disorders of teeth and supporting structures: Secondary | ICD-10-CM

## 2022-10-30 MED ORDER — AMOXICILLIN-POT CLAVULANATE 875-125 MG PO TABS: 1.0000 | ORAL_TABLET | Freq: Two times a day (BID) | ORAL | 0 refills | Status: AC

## 2022-10-30 MED ORDER — IBUPROFEN 800 MG PO TABS: 800.0000 mg | ORAL_TABLET | Freq: Three times a day (TID) | ORAL | 0 refills | Status: AC
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
## 2022-10-30 NOTE — Discharge Instructions (Addendum)
Please start taking the antibiotics twice daily with food.  You can take and or milligrams of ibuprofen every 8 hours for pain and inflammation.  It is important that you follow-up with a dentist to get to the root of this issue, as if there is an infection it will keep reoccurring.  [facility] in [place] Address: [address] [place], [place], [place] [address] Phone: [phone]  [facility] [phone] extension [phone] [address]  Dr. Lawrence Marseilles [phone] [address]  [place] [phone] [address] [facility] [employer].  [facility] [phone] extension [phone] [place], [place], [address] First come first serve for the first 10 clients.  May do simple extractions only, no wisdom teeth or surgery.  You may try the second for Thursday of the month starting at 6:30 AM.  [facility] You may call the school to see if they are still helping to provide dental care for emergent cases.

## 2022-10-30 NOTE — ED Triage Notes (Signed)
Patient c/o right lower dental pain that started yesterday. N facial or difficulty swallowing.  Patient states he has been taking Excedrin and Tylenol was taken last at 1400 today.
